# Patient Record
Sex: Male | Born: 1985 | Race: Black or African American | Hispanic: No | Marital: Married | Smoking: Current every day smoker
Health system: Southern US, Community
[De-identification: ages and names within clinical notes are randomized; demographics above are authoritative.]

---

## 2012-01-11 ENCOUNTER — Emergency Department: Payer: Self-pay | Admitting: Emergency Medicine

## 2012-06-02 ENCOUNTER — Ambulatory Visit: Payer: Self-pay | Admitting: Family Medicine

## 2014-07-09 ENCOUNTER — Encounter (HOSPITAL_BASED_OUTPATIENT_CLINIC_OR_DEPARTMENT_OTHER): Payer: Self-pay

## 2014-07-09 ENCOUNTER — Emergency Department (HOSPITAL_BASED_OUTPATIENT_CLINIC_OR_DEPARTMENT_OTHER)
Admission: EM | Admit: 2014-07-09 | Discharge: 2014-07-09 | Disposition: A | Discharge status: Home / Self Care | Payer: Self-pay | Attending: Emergency Medicine | Admitting: Emergency Medicine

## 2014-07-09 DIAGNOSIS — K088 Other specified disorders of teeth and supporting structures: Secondary | ICD-10-CM | POA: Insufficient documentation

## 2014-07-09 DIAGNOSIS — Z720 Tobacco use: Secondary | ICD-10-CM | POA: Insufficient documentation

## 2014-07-09 DIAGNOSIS — K0889 Other specified disorders of teeth and supporting structures: Secondary | ICD-10-CM

## 2014-07-09 MED ORDER — PENICILLIN V POTASSIUM 500 MG PO TABS: 500.0000 mg | ORAL_TABLET | Freq: Four times a day (QID) | ORAL | Status: AC

## 2014-07-09 MED ORDER — BUPIVACAINE-EPINEPHRINE (PF) 0.5% -1:200000 IJ SOLN
1.8000 mL | Freq: Once | INTRAMUSCULAR | Status: DC
  Filled 2014-07-09: qty 1.8

## 2014-07-09 NOTE — ED Notes (Signed)
Pt reports left lower dental pain with radiation upwards part of face, no swelling noted. Also reports uvular pain.

## 2014-07-09 NOTE — Discharge Instructions (Signed)
Return to the emergency room with worsening of symptoms, new symptoms or with symptoms that are concerning, fevers, unable to open mouth fully, facial swelling, tongue swelling, drooling, chest pain or shortness of breath. Please take all of your antibiotics until finished!   You may develop abdominal discomfort or diarrhea from the antibiotic.  You may help offset this with probiotics which you can buy or get in yogurt. Do not eat  or take the probiotics until 2 hours after your antibiotic.  Call to make appointment with the dentist above. Please call your doctor for a followup appointment within 24-48 hours. When you talk to your doctor please let them know that you were seen in the emergency department and have them acquire all of your records so that they can discuss the findings with you and formulate a treatment plan to fully care for your new and ongoing problems. If you do not have a primary care provider please call the number below under ED resources to establish care with a provider and follow up.    Emergency Department Resource Guide 1) Find a Doctor and Pay Out of Pocket Although you won't have to find out who is covered by your insurance plan, it is a good idea to ask around and get recommendations. You will then need to call the office and see if the doctor you have chosen will accept you as a new patient and what types of options they offer for patients who are self-pay. Some doctors offer discounts or will set up payment plans for their patients who do not have insurance, but you will need to ask so you aren't surprised when you get to your appointment.  2) Contact Your Local Health Department Not all health departments have doctors that can see patients for sick visits, but many do, so it is worth a call to see if yours does. If you don't know where your local health department is, you can check in your phone book. The CDC also has a tool to help you locate your state's health  department, and many state websites also have listings of all of their local health departments.  3) Find a Walk-in Clinic If your illness is not likely to be very severe or complicated, you may want to try a walk in clinic. These are popping up all over the country in pharmacies, drugstores, and shopping centers. They're usually staffed by nurse practitioners or physician assistants that have been trained to treat common illnesses and complaints. They're usually fairly quick and inexpensive. However, if you have serious medical issues or chronic medical problems, these are probably not your best option.  No Primary Care Doctor: - Call Health Connect at  [phone] - they can help you locate a primary care doctor that  accepts your insurance, provides certain services, etc. - Physician Referral Service- [phone]  Chronic Pain Problems: Organization         Address  Phone   Notes  [facility]  [phone] Patients need to be referred by their primary care doctor.   Medication Assistance: Organization         Address  Phone   Notes  [facility] [address] [place], [place] [phone] --Must be a resident of [facility] -- Must have NO insurance coverage whatsoever (no Medicaid/ Medicare, etc.) -- The pt. MUST have a primary care doctor that directs their care regularly and follows them in the  community   [facility]ist  [phone]   [address]  [phone]    Agencies that provide inexpensive medical care: Organization         Address  Phone   Notes  [facility]  [phone]   [facility]    [phone]   [facility] [address] [employer] [place], [place] [address] [phone]   [facility] [address], [place] [phone]   Planned Parenthood    [phone]   [facility]    [phone]    Community Health and [facility]  [address], [place] Phone:  [phone], Fax:  [phone] Hours of Operation:  9 am - 6 pm, M-F.  Also accepts Medicaid/Medicare and self-pay.  [facility]  [address], [place] Phone: [phone], Fax: [phone]. Hours of Operation:  8:30 am - 5:30 pm, M-F.  Also accepts Medicaid and self-pay.  [facility] [place] [address], [place] Phone: [phone]   [facility] [address] Natasha Bence [place], [place] [phone], Ext. [phone] Mondays & Thursdays: 7-9 AM.  First 15 patients are seen on a first come, first serve basis.    Medicaid-accepting [facility] Providers:  Organization         Address  Phone   Notes  [facility] [address], [place] [phone] Also accepts self-pay patients.  [facility] [address] Laurell Josephs [place], [place]  [phone]   [facility] [address], [place] [phone]   [facility] [address], [place] [phone]   Renaye Rakers [address], [place]   [phone] Only accepts [place] Access [place] patients after they have their name applied to their card.   Self-Pay (no insurance) in [facility]:  Organization         Address  Phone   Notes  Sickle Cell Patients, [facility] [address] [place], [place] [phone]   [facility] [address] [place], [place] [phone]   [facility]  [address], [place] [phone]   [facility]/Dr. Osei-Bonsu  [address] [place] or [address], [place] [phone] Phone number for both [place] and [place] locations is the same.  Urgent Medical and [facility] [address], [place] [phone]    [facility] [address] [place] or [address] [phone]   [facility] [address], [place] [phone], phone; [phone], fax Sees patients 1st and 3rd Saturday of every month.  Must not qualify for public or private insurance (i.e. Medicaid, Medicare, [place] Health Choice, Veterans' Benefits)  Household income should be no more than 200% of the poverty level The clinic cannot treat you if you are pregnant or think you are pregnant  Sexually transmitted diseases are not treated at the clinic.    Dental Care: Organization         Address  Phone  Notes  [facility] Department of [facility] [address] [place], [place] [phone] Accepts children up to age 49 who are enrolled in [place] or  [place] Health Choice; pregnant women with a Medicaid card; and children who have applied for Medicaid or [place] Health Choice, but were declined, whose parents can pay a reduced fee at time of service.  [facility] Department of [facility] [place]  [address], [place] [phone] Accepts children up to age 27 who are enrolled in [place] or [place] Health Choice; pregnant women with a Medicaid card; and children who have applied for Medicaid or [place] Health Choice, but were declined, whose parents can pay a reduced fee at time of service.  [facility]  [address], [place] [phone] Patients are seen by appointment only. Walk-ins are not accepted. [facility] will see patients 27 years of age and older. Monday - Tuesday (8am-5pm) Most Wednesdays (8:30-5pm) $30 per visit, cash only  [facility]  [address], [facility] [phone] Patients are seen by appointment only. Walk-ins are not accepted. [facility] will see patients 27 years of age and older. One Wednesday Evening (Monthly: Volunteer Based).   $30 per visit, cash only  [facility]  [phone] for adults; Children under age 134, call Graduate Pediatric Dentistry at [phone]. Children aged 244-14, please call [phone] to request a pediatric application.  Dental services are provided in all areas of dental care including fillings, crowns and bridges, complete and partial dentures, implants, gum treatment, root canals, and extractions. Preventive care is also provided. Treatment is provided to both adults and children. Patients are selected via a lottery and there is often a waiting list.   [facility] [address], [place]  [phone] [email or web address]   [facility] [address], [place], [place] [phone], Ext. [phone] Second and Fourth Thursday of each month, opens at 6:30 AM; Clinic ends at 9 AM.  Patients are seen on a first-come first-served basis, and a limited number are seen during each clinic.   [facility]  [address], [place], [place] [phone]   Eligibility Requirements You must have lived in [place], [place], or [place] counties for at least the last three months.   You cannot be eligible for state or federal sponsored [place]healthcare insurance, including CIGNAVeterans Administration, [place]icaid, or [employer]icare.   You generally cannot be eligible for healthcare insurance through your employer.    How to apply: Eligibility screenings are held every Tuesday and Wednesday afternoon from 1:00 pm until 4:00 pm. You do not need an appointment for the interview!  [facility] [address], [place], [place] [phone]   [facility]  [phone]   [facility]  [phone]   [facility]  [phone]    Behavioral Health Resources in the Community: Intensive Outpatient Programs Organization         Address  Phone  Notes  [facility] [address], [place], [place] [phone]   [facility] [address], [place], [place] [phone]   [facility] [address], [place], [place]  [phone]   [facility] [address],  [place], [place] [phone] or [phone]   Substance Abuse Resources Organization         Address  Phone  Notes  [facility] Drug [facility]  [phone]   [facility]  [phone]   [facility]  [phone]   Floydene Flock  [phone]   Residential & Outpatient Substance Abuse Program  [phone]   Psychological Services Organization         Address  Phone  Notes  [facility] Health  [phone]   [facility]  [phone]   [facility] [address] [place] [phone] or [phone]    Mobile Crisis Teams Organization         Address  Phone  Notes  [facility], Mobile Crisis Care Unit  [phone]   [facility]  [address] [place], [place] [phone]   Doristine Locks [address] [place] [phone]    Self-Help/Support Groups Organization         Address  Phone             Notes  [facility] [place] - variety of support groups  [phone]- [phone] Call for more information  Narcotics Anonymous (NA), [facility] [address], [employer] [place]  2 meetings at this location   Statistician         Address  Phone  Notes  [facility] [address],    [place]  [phone]   [facility]  [address], [place] [address], [place], [place] [phone]   [facility] [address] [place], [place] [phone] Admissions: 8am-3pm M-F  [facility] [address],    [place], [place] [phone]   [facility] [address] [place], [place], [place] [phone]   [facility] [address]  [place], [place] [phone]   [facility] - Intensive Outpatient [address] Laurell [address], [place], [place] [phone]   [facility] [address] [place].,  [place], [place] [phone] or [phone]   [facility] ([facility]) [address] [place], [place] [phone] Accepts Medicaid  [facility] [address]  [place] [phone] Substance Abuse/Addiction Treatment   [facility] Organization         Address  Phone  Notes  [facility]  [phone]   Angie Fava, PhD [address] Ervin Knack [place], [place]   [phone] or [phone]   [facility] Behavioral   [address] [place], [place] [phone]   [facility] [address], [place], [place] [phone] Insurance/Medicaid/sponsorship through [facility] and Families [address]                                    [place], [place] [phone] Therapy/tele-psych/case  [facility] [address], [place] [phone]    Dr. Lolly Mustache  [phone]   [facility]  [employer] [facility] Dept. 1) [address] [place] 2) [address] [place] 3)  [address], [place] [phone]  [phone]   [facility] Child Abuse Hotline [phone] or [phone] (After Hours)

## 2014-07-09 NOTE — ED Provider Notes (Signed)
CSN: [id number]     Arrival date & time 07/09/14  1535 History   First MD Initiated Contact with Patient 07/09/14 1717     Chief Complaint  Patient presents with  . Dental Pain     (Consider location/radiation/quality/duration/timing/severity/associated sxs/prior Treatment) HPI  Joshua Armstrong is a 29 y.o. male presenting with left lower dental pain for the past 2 days that radiates upward towards his face. No improvement with Tylenol, ibuprofen. Patient reports some improvement with cold or fluids. Last saw dentist was one year ago while he was incarcerated. No facial swelling, tongue swelling, fevers, chills, drooling, unable to open mouth, chest pain or shortness of breath. Patient does report dry cough and URI symptoms including sore throat. No history of diabetes. Patient is a smoker.   History reviewed. No pertinent past medical history. History reviewed. No pertinent past surgical history. No family history on file. History  Substance Use Topics  . Smoking status: Current Every Day Smoker -- 1.00 packs/day    Types: Cigarettes  . Smokeless tobacco: Not on file  . Alcohol Use: No    Review of Systems  Constitutional: Negative for fever and chills.  HENT: Positive for dental problem. Negative for drooling and facial swelling.   Respiratory: Negative for shortness of breath and stridor.   Cardiovascular: Negative for chest pain.      Allergies  Review of patient's allergies indicates no known allergies.  Home Medications   Prior to Admission medications   Medication Sig Start Date End Date Taking? Authorizing Provider  penicillin v potassium (VEETID) 500 MG tablet Take 1 tablet (500 mg total) by mouth 4 (four) times daily. 07/09/14 07/16/14  Oswaldo ConroyVictoria Gjon Letarte, PA-C   BP 146/86 mmHg  Pulse 101  Temp(Src) 98.4 F (36.9 C) (Oral)  Resp 20  Ht 5\' 11"  (1.803 m)  Wt 265 lb (120.203 kg)  BMI 36.98 kg/m2  SpO2 96% Physical Exam  Constitutional: He appears well-developed and  well-nourished. No distress.  HENT:  Head: Normocephalic and atraumatic.  Mouth/Throat: No oropharyngeal exudate.  No trismus or uvula deviation. Mild oropharynx erythema without edema. No lip, tongue, facial swelling. No swelling under tongue or tenderness. Patient handling secretions. No drooling. Patient with poor dentition. Patient with tenderness to left back molar that is erupting from the gum with mild erythema in this gingiva but no drainable abscess.   Eyes: Conjunctivae are normal. Right eye exhibits no discharge. Left eye exhibits no discharge.  Neck: Normal range of motion. Neck supple.  No neck masses or tenderness.  Cardiovascular: Normal rate and regular rhythm.   Pulmonary/Chest: Effort normal and breath sounds normal. No respiratory distress. He has no wheezes.  Abdominal: Soft. He exhibits no distension. There is no tenderness.  Lymphadenopathy:    He has no cervical adenopathy.  Neurological: He is alert. Coordination normal.  Skin: Skin is warm and dry. He is not diaphoretic.  Nursing note and vitals reviewed.   ED Course  Dental Date/Time: 07/09/2014 5:53 PM Performed by: Oswaldo ConroyREECH, Danis [place] Authorized by: Oswaldo ConroyREECH, Tannar Broker Consent: Verbal consent obtained. Risks and benefits: risks, benefits and alternatives were discussed Consent given by: patient Patient understanding: patient states understanding of the procedure being performed Patient consent: the patient's understanding of the procedure matches consent given Patient identity confirmed: verbally with patient Time out: Immediately prior to procedure a "time out" was called to verify the correct patient, procedure, equipment, support staff and site/side marked as required. Local anesthesia used: yes Local anesthetic: bupivacaine 0.5% with epinephrine  Anesthetic total: 1.8 ml Patient tolerance: Patient tolerated the procedure well with no immediate complications   (including critical care time) Labs  Review Labs Reviewed - No data to display  Imaging Review No results found.   EKG Interpretation None      MDM   Final diagnoses:  Pain, dental   Patient with toothache.  No gross abscess.  No history of diabetes. Exam unconcerning for Ludwig's angina or spread of infection.  Will treat with penicillin. Pt given dental block in ED with improvement of his symptoms. Urged patient to follow-up with dentist. Referral to on call dentist.  ED resources provided as well.  Discussed return precautions with patient. Discussed all results and patient verbalizes understanding and agrees with plan.     Oswaldo ConroyVictoria Garrie Elenes, PA-C 07/09/14 1817  Raeford RazorStephen Kohut, MD 07/09/14 2023

## 2015-11-17 ENCOUNTER — Emergency Department (HOSPITAL_BASED_OUTPATIENT_CLINIC_OR_DEPARTMENT_OTHER)
Admission: EM | Admit: 2015-11-17 | Discharge: 2015-11-17 | Disposition: A | Discharge status: Home / Self Care | Payer: Self-pay | Attending: Emergency Medicine | Admitting: Emergency Medicine

## 2015-11-17 ENCOUNTER — Encounter (HOSPITAL_BASED_OUTPATIENT_CLINIC_OR_DEPARTMENT_OTHER): Payer: Self-pay | Admitting: Emergency Medicine

## 2015-11-17 DIAGNOSIS — F17210 Nicotine dependence, cigarettes, uncomplicated: Secondary | ICD-10-CM | POA: Insufficient documentation

## 2015-11-17 DIAGNOSIS — R109 Unspecified abdominal pain: Secondary | ICD-10-CM

## 2015-11-17 DIAGNOSIS — R197 Diarrhea, unspecified: Secondary | ICD-10-CM | POA: Insufficient documentation

## 2015-11-17 DIAGNOSIS — R609 Edema, unspecified: Secondary | ICD-10-CM

## 2015-11-17 DIAGNOSIS — K118 Other diseases of salivary glands: Secondary | ICD-10-CM | POA: Insufficient documentation

## 2015-11-17 DIAGNOSIS — R1033 Periumbilical pain: Secondary | ICD-10-CM | POA: Insufficient documentation

## 2015-11-17 NOTE — Discharge Instructions (Signed)
You may have sludging or a small salivary gland stone. Try eating sour candies to help make more saliva and stay hydrated. If symptoms worsen and cause difficulty swallowing or breathing, please return to the nearest ED.  You abdominal pain appears to be related to your diarrhea episodes. If symptoms worsen, please return nearest ED.  Please schedule appointment with a primary care physician for long-term management of your symptoms.

## 2015-11-17 NOTE — ED Triage Notes (Signed)
Patient reports "knot" on neck which began a few days ago.  Denies fevers. Patient also reports abdominal pain which began 2 weeks ago.  Denies N/V.  Reports intermittent diarrhea.

## 2015-11-17 NOTE — ED Provider Notes (Signed)
[facility] Provider Note   CSN: [id number] Arrival date & time: 11/17/15  1134     History   Chief Complaint Chief Complaint  Patient presents with  . Cyst  . Abdominal Pain    HPI Joshua Armstrong is a 30 y.o. male with no significant past medical history who presents for 2 complaints, "knot"  under his tongue, and a several week history of diarrhea with associated abdominal pain. Patient reports that he noticed a bump under his left side of his tongue several days ago. Patient reports that it is not painful and he has not had this before. Patient denies any sore throat, difficulty breathing, difficulty swallowing, difficulty with neck movement. Patient denies any neck trauma and denies difficulty moving his mouth. Patient has no reported trismus. Patient's other complaint is several weeks of intermittent diarrhea with abdominal cramping. Patient denies constipation, dysuria, urinary symptoms, or any abdominal trauma. Patient reports he moved to a new apartment several weeks ago and thinks "the water is upsetting his stomach." He reports that it improved when he drank bottled water. Patient denies any fevers, chills, night sweats, chest pain, shortness of breath, nausea, vomiting. Patient has no other complaints today.  Diarrhea   This is a chronic problem. The current episode started more than 1 week ago. Episode frequency: every few days has an episode. The problem has not changed since onset.The stool consistency is described as watery. There has been no fever. Associated symptoms include abdominal pain (preceeding the diarrhea). Pertinent negatives include no vomiting, no chills, no sweats, no headaches, no URI and no cough. He has tried nothing for the symptoms. Risk factors include ill contacts. His past medical history does not include irritable bowel syndrome, inflammatory bowel disease, bowel resection or recent abdominal surgery.  Abdominal Pain   This is a chronic  problem. The current episode started more than 1 week ago. The problem occurs every several days. The problem has been resolved. The pain is located in the periumbilical region. The quality of the pain is cramping. The pain is at a severity of 6/10. The pain is moderate. Associated symptoms include diarrhea. Pertinent negatives include fever, melena, vomiting, dysuria, frequency and headaches. The symptoms are aggravated by palpation. The symptoms are relieved by bowel activity. His past medical history does not include irritable bowel syndrome.    History reviewed. No pertinent past medical history.  There are no active problems to display for this patient.   History reviewed. No pertinent surgical history.     Home Medications    Prior to Admission medications   Not on File    Family History History reviewed. No pertinent family history.  Social History Social History  Substance Use Topics  . Smoking status: Current Every Day Smoker    Packs/day: 1.00    Types: Cigarettes  . Smokeless tobacco: Never Used  . Alcohol use No     Allergies   Review of patient's allergies indicates no known allergies.   Review of Systems Review of Systems  Constitutional: Negative for appetite change, chills and fever.  HENT: Negative for congestion.   Eyes: Negative for visual disturbance.  Respiratory: Negative for cough, choking, chest tightness, shortness of breath, wheezing and stridor.   Cardiovascular: Negative for chest pain and palpitations.  Gastrointestinal: Positive for abdominal pain (preceeding the diarrhea) and diarrhea. Negative for melena and vomiting.  Genitourinary: Negative for difficulty urinating, discharge, dysuria, flank pain, frequency, genital sores, penile pain, penile swelling, scrotal swelling and  testicular pain.  Musculoskeletal: Negative for back pain.  Skin: Negative for wound.  Neurological: Negative for weakness, light-headedness, numbness and headaches.   Psychiatric/Behavioral: Negative for agitation and confusion.  All other systems reviewed and are negative.    Physical Exam Updated Vital Signs BP 143/85 (BP Location: Left Arm)   Pulse 72   Temp 98.3 F (36.8 C) (Oral)   Ht 5\' 10"  (1.778 m)   Wt 255 lb (115.7 kg)   SpO2 97%   BMI 36.59 kg/m   Physical Exam  Constitutional: He appears well-developed and well-nourished.  HENT:  Head: Atraumatic.  Mouth/Throat: Oropharynx is clear and moist. No oropharyngeal exudate.  Eyes: Conjunctivae and EOM are normal. Pupils are equal, round, and reactive to light.  Neck: Normal range of motion. Neck supple. No tracheal tenderness, no spinous process tenderness and no muscular tenderness present. No neck rigidity. Normal range of motion present.    Cardiovascular: Normal rate and regular rhythm.   No murmur heard. Pulmonary/Chest: Effort normal and breath sounds normal. No stridor. No respiratory distress. He has no wheezes. He exhibits no tenderness.  Abdominal: Soft. He exhibits no distension. There is no tenderness. There is no guarding. Hernia confirmed negative in the right inguinal area and confirmed negative in the left inguinal area.  Genitourinary: Testes normal and penis normal. No penile tenderness.  Musculoskeletal: He exhibits no edema or tenderness.  Lymphadenopathy:    He has cervical adenopathy. No inguinal adenopathy noted on the right or left side.  Neurological: He is alert. He exhibits normal muscle tone.  Skin: Skin is warm and dry. No erythema.  Psychiatric: He has a normal mood and affect.  Nursing note and vitals reviewed.    ED Treatments / Results  Labs (all labs ordered are listed, but only abnormal results are displayed) Labs Reviewed - No data to display  EKG  EKG Interpretation None       Radiology No results found.  Procedures Procedures (including critical care time)  Medications Ordered in ED Medications - No data to  display   Initial Impression / Assessment and Plan / ED Course  I have reviewed the triage vital signs and the nursing notes.  Pertinent labs & imaging results that were available during my care of the patient were reviewed by me and considered in my medical decision making (see chart for details).  Clinical Course    Joshua Armstrong is a 30 y.o. male with no significant past medical history who presents with diarrhea, abdominal discomfort, and a knot under his jaw. Patient's history and physical as above. Patient's story for abdominal symptoms appear to be consistent with pain associated with diarrheal episodes. Patient reports after his diarrhea, his discomfort is relieved. Patient reports that he has not had the abdominal pain in several days and on exam was completely nontender. GU exam revealed no hernias. Patient had no CVA tenderness or any other finding on abdominal exam. Given patient's reported history of symptoms beginning after switching water source, patient advised to try bottled water again which previously improved symptoms.   For knot under patient's jaw, exam consistent with salivary gland swelling. Patient did not have any findings such as trismus, nuchal rigidity, tonsillar swelling, uvular deviation, or retropharyngeal abnormality. Swelling under jaw was nontender. It was mobile and approximately half a centimeter in size. No induration under jaw, fluctuance, and no induration under tongue. Doubt Ludwig's angina or other infection at this time. Patient also advised that swelling could be lymph node  secondary to reaction from virus.  Patient instructed to use sour candy as well as compresses under jaw to try and alleviate possible sialolithiasis versus sludging of submandibular/sublingual gland. Patient given strict return precautions for any changes or findings concerning for infection. Patient understood return precautions for respiratory or swallowing changes.   Patient also  instructed to follow-up with PCP for long-term management plan of intermittent diarrhea. Patient also given return precautions for worsening symptoms. Patient instructed to stay hydrated.  Patient had no other questions or concerns and patient discharged in good condition.   Final Clinical Impressions(s) / ED Diagnoses   Final diagnoses:  Submandibular gland swelling  Diarrhea, unspecified type  Abdominal pain, unspecified abdominal location    New Prescriptions There are no discharge medications for this patient.  Clinical Impression: 1. Submandibular gland swelling   2. Diarrhea, unspecified type   3. Abdominal pain, unspecified abdominal location     Disposition: Discharge  Condition: Good  I have discussed the results, Dx and Tx plan with the pt(& family if present). He/she/they expressed understanding and agree(s) with the plan. Discharge instructions discussed at great length. Strict return precautions discussed and pt &/or family have verbalized understanding of the instructions. No further questions at time of discharge.    There are no discharge medications for this patient.   [facility] [address] [place] [id number] [phone] Schedule an appointment as soon as possible for a visit  If symptoms worsen, please return to the nearest ED.      Canary Brimhristopher J Liyah Higham, MD 11/17/15 1538

## 2016-10-26 ENCOUNTER — Encounter (HOSPITAL_BASED_OUTPATIENT_CLINIC_OR_DEPARTMENT_OTHER): Payer: Self-pay

## 2016-10-26 ENCOUNTER — Emergency Department (HOSPITAL_BASED_OUTPATIENT_CLINIC_OR_DEPARTMENT_OTHER): Payer: Self-pay

## 2016-10-26 ENCOUNTER — Emergency Department (HOSPITAL_BASED_OUTPATIENT_CLINIC_OR_DEPARTMENT_OTHER)
Admission: EM | Admit: 2016-10-26 | Discharge: 2016-10-26 | Disposition: A | Discharge status: Home / Self Care | Payer: Self-pay | Attending: Emergency Medicine | Admitting: Emergency Medicine

## 2016-10-26 DIAGNOSIS — B9789 Other viral agents as the cause of diseases classified elsewhere: Secondary | ICD-10-CM

## 2016-10-26 DIAGNOSIS — Z79899 Other long term (current) drug therapy: Secondary | ICD-10-CM | POA: Insufficient documentation

## 2016-10-26 DIAGNOSIS — J069 Acute upper respiratory infection, unspecified: Secondary | ICD-10-CM | POA: Insufficient documentation

## 2016-10-26 DIAGNOSIS — F17210 Nicotine dependence, cigarettes, uncomplicated: Secondary | ICD-10-CM | POA: Insufficient documentation

## 2016-10-26 LAB — COMPREHENSIVE METABOLIC PANEL
ALT: 16 U/L — ABNORMAL LOW (ref 17–63)
AST: 21 U/L (ref 15–41)
Albumin: 4.3 g/dL (ref 3.5–5.0)
Alkaline Phosphatase: 69 U/L (ref 38–126)
Anion gap: 8 (ref 5–15)
BUN: 12 mg/dL (ref 6–20)
CO2: 27 mmol/L (ref 22–32)
Calcium: 9.2 mg/dL (ref 8.9–10.3)
Chloride: 105 mmol/L (ref 101–111)
Creatinine, Ser: 0.87 mg/dL (ref 0.61–1.24)
GFR calc Af Amer: >60 mL/min (ref >60)
GFR calc non Af Amer: >60 mL/min (ref >60)
Glucose, Bld: 89 mg/dL (ref 65–99)
Potassium: 3.8 mmol/L (ref 3.5–5.1)
Sodium: 140 mmol/L (ref 135–145)
Total Bilirubin: 0.7 mg/dL (ref 0.3–1.2)
Total Protein: 7.8 g/dL (ref 6.5–8.1)

## 2016-10-26 LAB — CBC WITH DIFFERENTIAL/PLATELET
Basophils Absolute: 0 10*3/uL (ref 0.0–0.1)
Basophils Relative: 0 %
Eosinophils Absolute: 0.1 10*3/uL (ref 0.0–0.7)
Eosinophils Relative: 2 %
HCT: 39.9 % (ref 39.0–52.0)
Hemoglobin: 13.5 g/dL (ref 13.0–17.0)
Lymphocytes Relative: 14 %
Lymphs Abs: 0.9 10*3/uL (ref 0.7–4.0)
MCH: 29.1 pg (ref 26.0–34.0)
MCHC: 33.8 g/dL (ref 30.0–36.0)
MCV: 86 fL (ref 78.0–100.0)
Monocytes Absolute: 0.6 10*3/uL (ref 0.1–1.0)
Monocytes Relative: 9 %
Neutro Abs: 5 10*3/uL (ref 1.7–7.7)
Neutrophils Relative %: 75 %
Platelets: 175 10*3/uL (ref 150–400)
RBC: 4.64 MIL/uL (ref 4.22–5.81)
RDW: 13.7 % (ref 11.5–15.5)
WBC: 6.5 10*3/uL (ref 4.0–10.5)

## 2016-10-26 LAB — URINALYSIS, ROUTINE W REFLEX MICROSCOPIC
Bilirubin Urine: NEGATIVE
Glucose, UA: NEGATIVE mg/dL
Hgb urine dipstick: NEGATIVE
Ketones, ur: NEGATIVE mg/dL
Leukocytes, UA: NEGATIVE
NITRITE: NEGATIVE
Protein, ur: NEGATIVE mg/dL
SPECIFIC GRAVITY, URINE: 1.024 (ref 1.005–1.030)
pH: 7.5 (ref 5.0–8.0)

## 2016-10-26 MED ORDER — SODIUM CHLORIDE 0.9 % IV BOLUS (SEPSIS): 1000.0000 mL | Freq: Once | INTRAVENOUS | Status: DC

## 2016-10-26 MED ORDER — PROMETHAZINE-DM 6.25-15 MG/5ML PO SYRP: 5.0000 mL | ORAL_SOLUTION | Freq: Four times a day (QID) | ORAL | 0 refills | Status: AC | PRN

## 2016-10-26 MED ORDER — IBUPROFEN 800 MG PO TABS: 800.0000 mg | ORAL_TABLET | Freq: Three times a day (TID) | ORAL | 0 refills | Status: AC | PRN

## 2016-10-26 MED ORDER — GUAIFENESIN ER 1200 MG PO TB12: 1.0000 | ORAL_TABLET | Freq: Two times a day (BID) | ORAL | 0 refills | Status: AC

## 2016-10-26 NOTE — ED Triage Notes (Signed)
Pt reports non-productive cough, and RLQ pain, emesis x1 two days ago - after being on a recent cruise in the [place]. Denies fever, nausea, or diarrhea.

## 2016-10-26 NOTE — Discharge Instructions (Signed)
Return here as needed.  Increase your fluid intake.  Your x-rays did not show any abnormalities.  Take Imodium for your diarrhea.

## 2016-10-26 NOTE — ED Notes (Signed)
Instructed on NPO due to abd pain

## 2016-10-28 NOTE — ED Provider Notes (Signed)
[facility]PT Provider Note   CSN: [id number] Arrival date & time: 10/26/16  1152     History   Chief Complaint Chief Complaint  Patient presents with  . Cough  . RLQ pain    HPI Joshua Armstrong is a 31 y.o. male.  HPI Patient presents to the emergency department with cough and also some right lower quadrant abdominal pain.  He said he vomited once 2 days ago.  The patient states that he also had some diarrhea.  They recently returned from a trip to the [place].  The patient states that his cough seems to make the pain in his abdomen hurt more.  Patient states he did not take any medications other than over-the-counter cough and cold medications prior to arrival.  He states nothing seems make the condition better or worseThe patient denies chest pain, shortness of breath, headache,blurred vision, neck pain, fever,  weakness, numbness, dizziness, anorexia, edema, a nausea, vomiting, diarrhea, rash, back pain, dysuria, hematemesis, bloody stool, near syncope, or syncope. History reviewed. No pertinent past medical history.  There are no active problems to display for this patient.   History reviewed. No pertinent surgical history.     Home Medications    Prior to Admission medications   Medication Sig Start Date End Date Taking? Authorizing Provider  Guaifenesin 1200 MG TB12 Take 1 tablet (1,200 mg total) by mouth 2 (two) times daily. 10/26/16   Dede Dobesh, Cristal Deer, PA-C  ibuprofen (ADVIL,MOTRIN) 800 MG tablet Take 1 tablet (800 mg total) by mouth every 8 (eight) hours as needed. 10/26/16   Shadi Sessler, Cristal Deer, PA-C  promethazine-dextromethorphan (PROMETHAZINE-DM) 6.25-15 MG/5ML syrup Take 5 mLs by mouth 4 (four) times daily as needed for cough. 10/26/16   Charlestine Night, PA-C    Family History History reviewed. No pertinent family history.  Social History Social History  Substance Use Topics  . Smoking status: Current Every Day Smoker    Packs/day: 1.00    Types:  Cigarettes  . Smokeless tobacco: Never Used  . Alcohol use No     Allergies   Patient has no known allergies.   Review of Systems Review of Systems All other systems negative except as documented in the HPI. All pertinent positives and negatives as reviewed in the HPI.  Physical Exam Updated Vital Signs BP 134/80 (BP Location: Right Arm)   Pulse 70   Temp 98.5 F (36.9 C) (Oral)   Resp 18   Ht 5\' 10"  (1.778 m)   Wt 115.2 kg (254 lb)   SpO2 99%   BMI 36.45 kg/m   Physical Exam  Constitutional: He is oriented to person, place, and time. He appears well-developed and well-nourished. No distress.  HENT:  Head: Normocephalic and atraumatic.  Mouth/Throat: Oropharynx is clear and moist.  Eyes: Pupils are equal, round, and reactive to light.  Neck: Normal range of motion. Neck supple.  Cardiovascular: Normal rate, regular rhythm and normal heart sounds.  Exam reveals no gallop and no friction rub.   No murmur heard. Pulmonary/Chest: Effort normal and breath sounds normal. No respiratory distress. He has no wheezes.  Abdominal: Soft. Bowel sounds are normal. He exhibits no distension. There is no tenderness.  Neurological: He is alert and oriented to person, place, and time. He exhibits normal muscle tone. Coordination normal.  Skin: Skin is warm and dry. Capillary refill takes less than 2 seconds. No rash noted. No erythema.  Psychiatric: He has a normal mood and affect. His behavior is normal.  Nursing note  and vitals reviewed.    ED Treatments / Results  Labs (all labs ordered are listed, but only abnormal results are displayed) Labs Reviewed  COMPREHENSIVE METABOLIC PANEL - Abnormal; Notable for the following:       Result Value   ALT 16 (*)    All other components within normal limits  URINALYSIS, ROUTINE W REFLEX MICROSCOPIC  CBC WITH DIFFERENTIAL/PLATELET    EKG  EKG Interpretation None       Radiology No results found.  Procedures Procedures  (including critical care time)  Medications Ordered in ED Medications - No data to display   Initial Impression / Assessment and Plan / ED Course  I have reviewed the triage vital signs and the nursing notes.  Pertinent labs & imaging results that were available during my care of the patient were reviewed by me and considered in my medical decision making (see chart for details).     Patient be treated for URI with cough.  Told to return here as needed.  His abdominal pain seems to be minimal basilar dry.  No pain on my examination and no guarding or rebound tenderness.  The patient to monitor the abdominal discomfort if he has any worsening in his condition.  Return here as needed.  Will treat with medications for the URI  Final Clinical Impressions(s) / ED Diagnoses   Final diagnoses:  Viral URI with cough    New Prescriptions Discharge Medication List as of 10/26/2016  2:52 PM    START taking these medications   Details  Guaifenesin 1200 MG TB12 Take 1 tablet (1,200 mg total) by mouth 2 (two) times daily., Starting Sat 10/26/2016, Print    ibuprofen (ADVIL,MOTRIN) 800 MG tablet Take 1 tablet (800 mg total) by mouth every 8 (eight) hours as needed., Starting Sat 10/26/2016, Print    promethazine-dextromethorphan (PROMETHAZINE-DM) 6.25-15 MG/5ML syrup Take 5 mLs by mouth 4 (four) times daily as needed for cough., Starting Sat 10/26/2016, Print         Sherma Vanmetre, [place], PA-C 10/28/16 1709    Tilden Fossa, MD 10/30/16 [phone]

## 2018-05-14 IMAGING — CR DG CHEST 2V
2 series · 2 of 2 positions shown · non-contrast
Comparison: None.

CLINICAL DATA: Cough and chest congestion for 5 days.  Smoker.

EXAM:
CHEST  2 VIEW

[w chest pa]
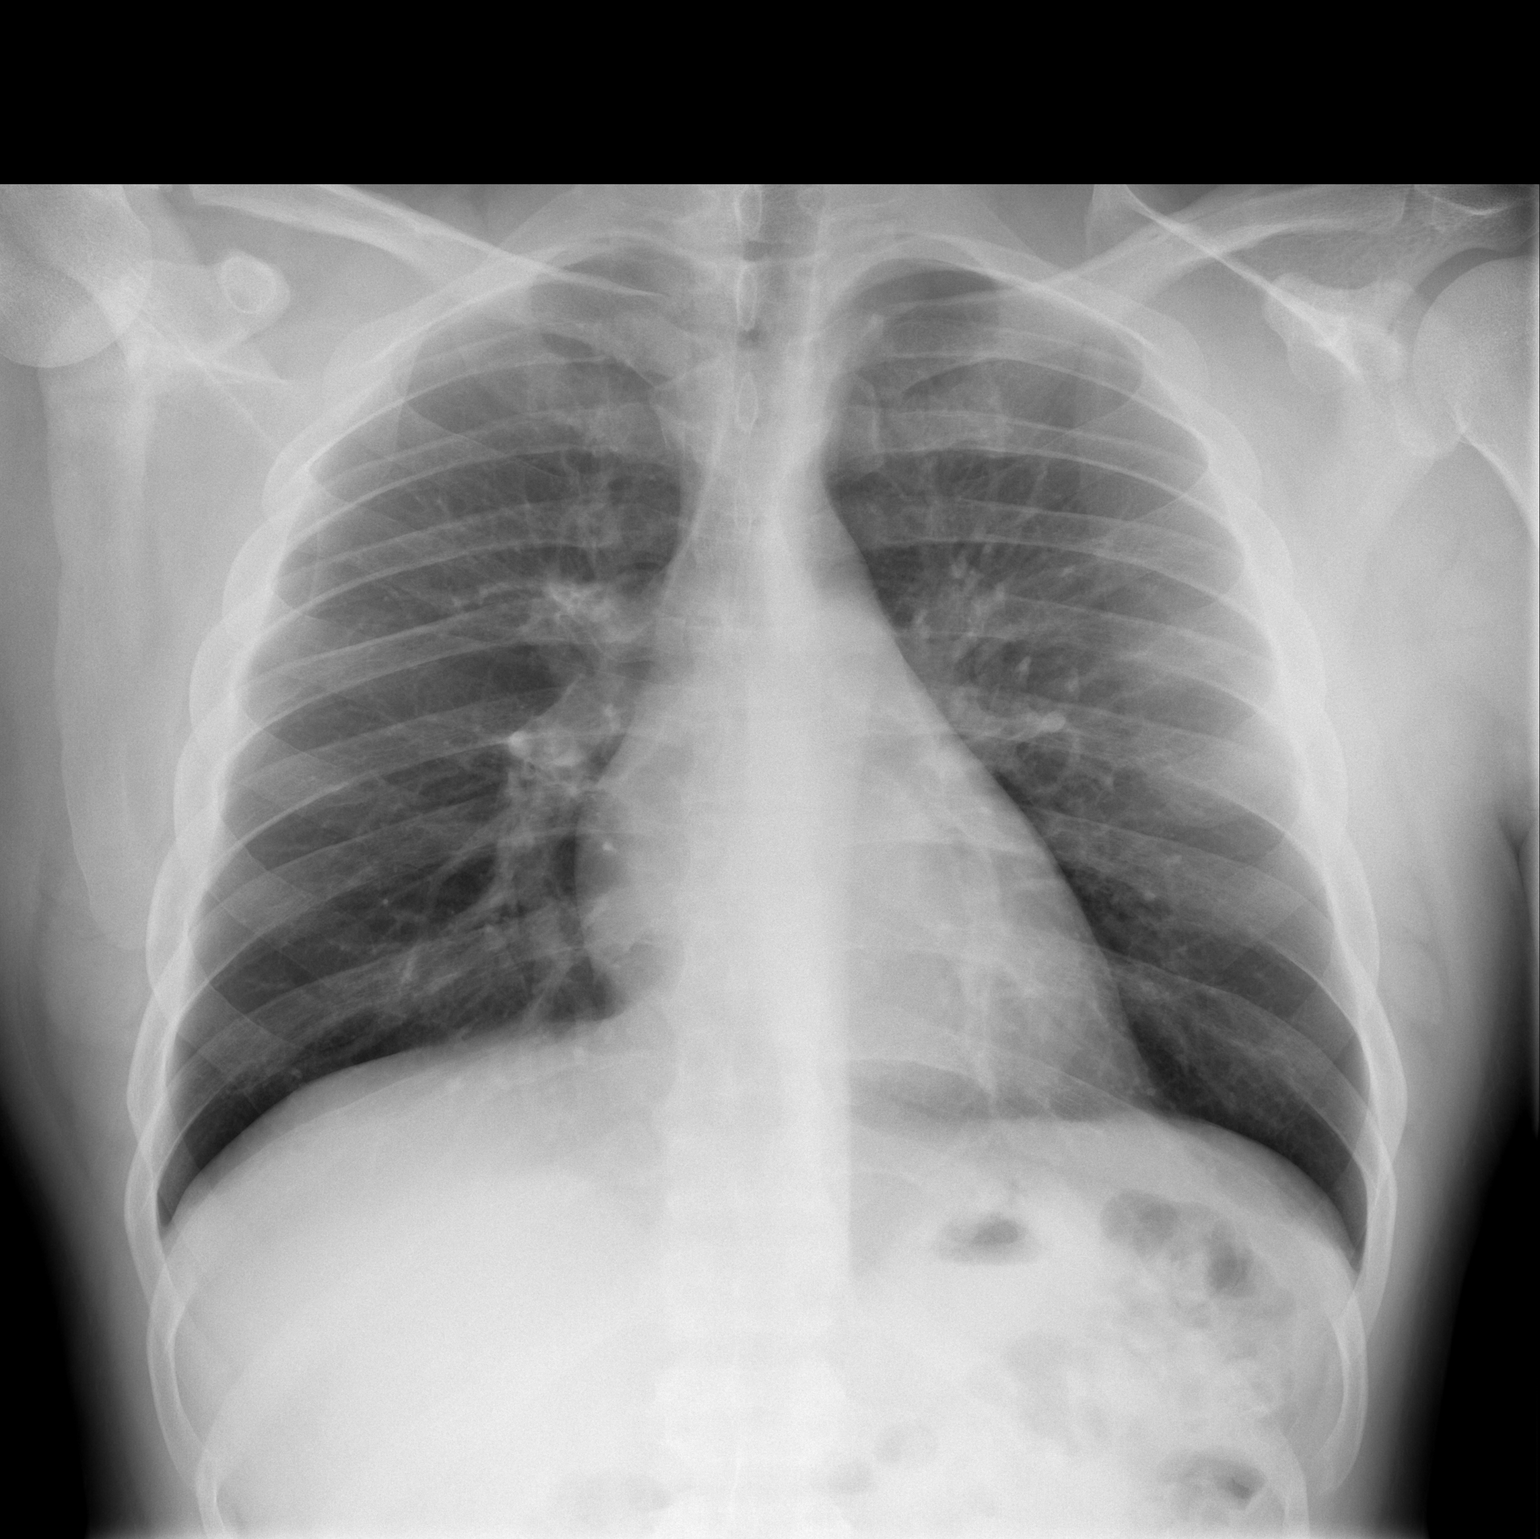

[w chest lat]
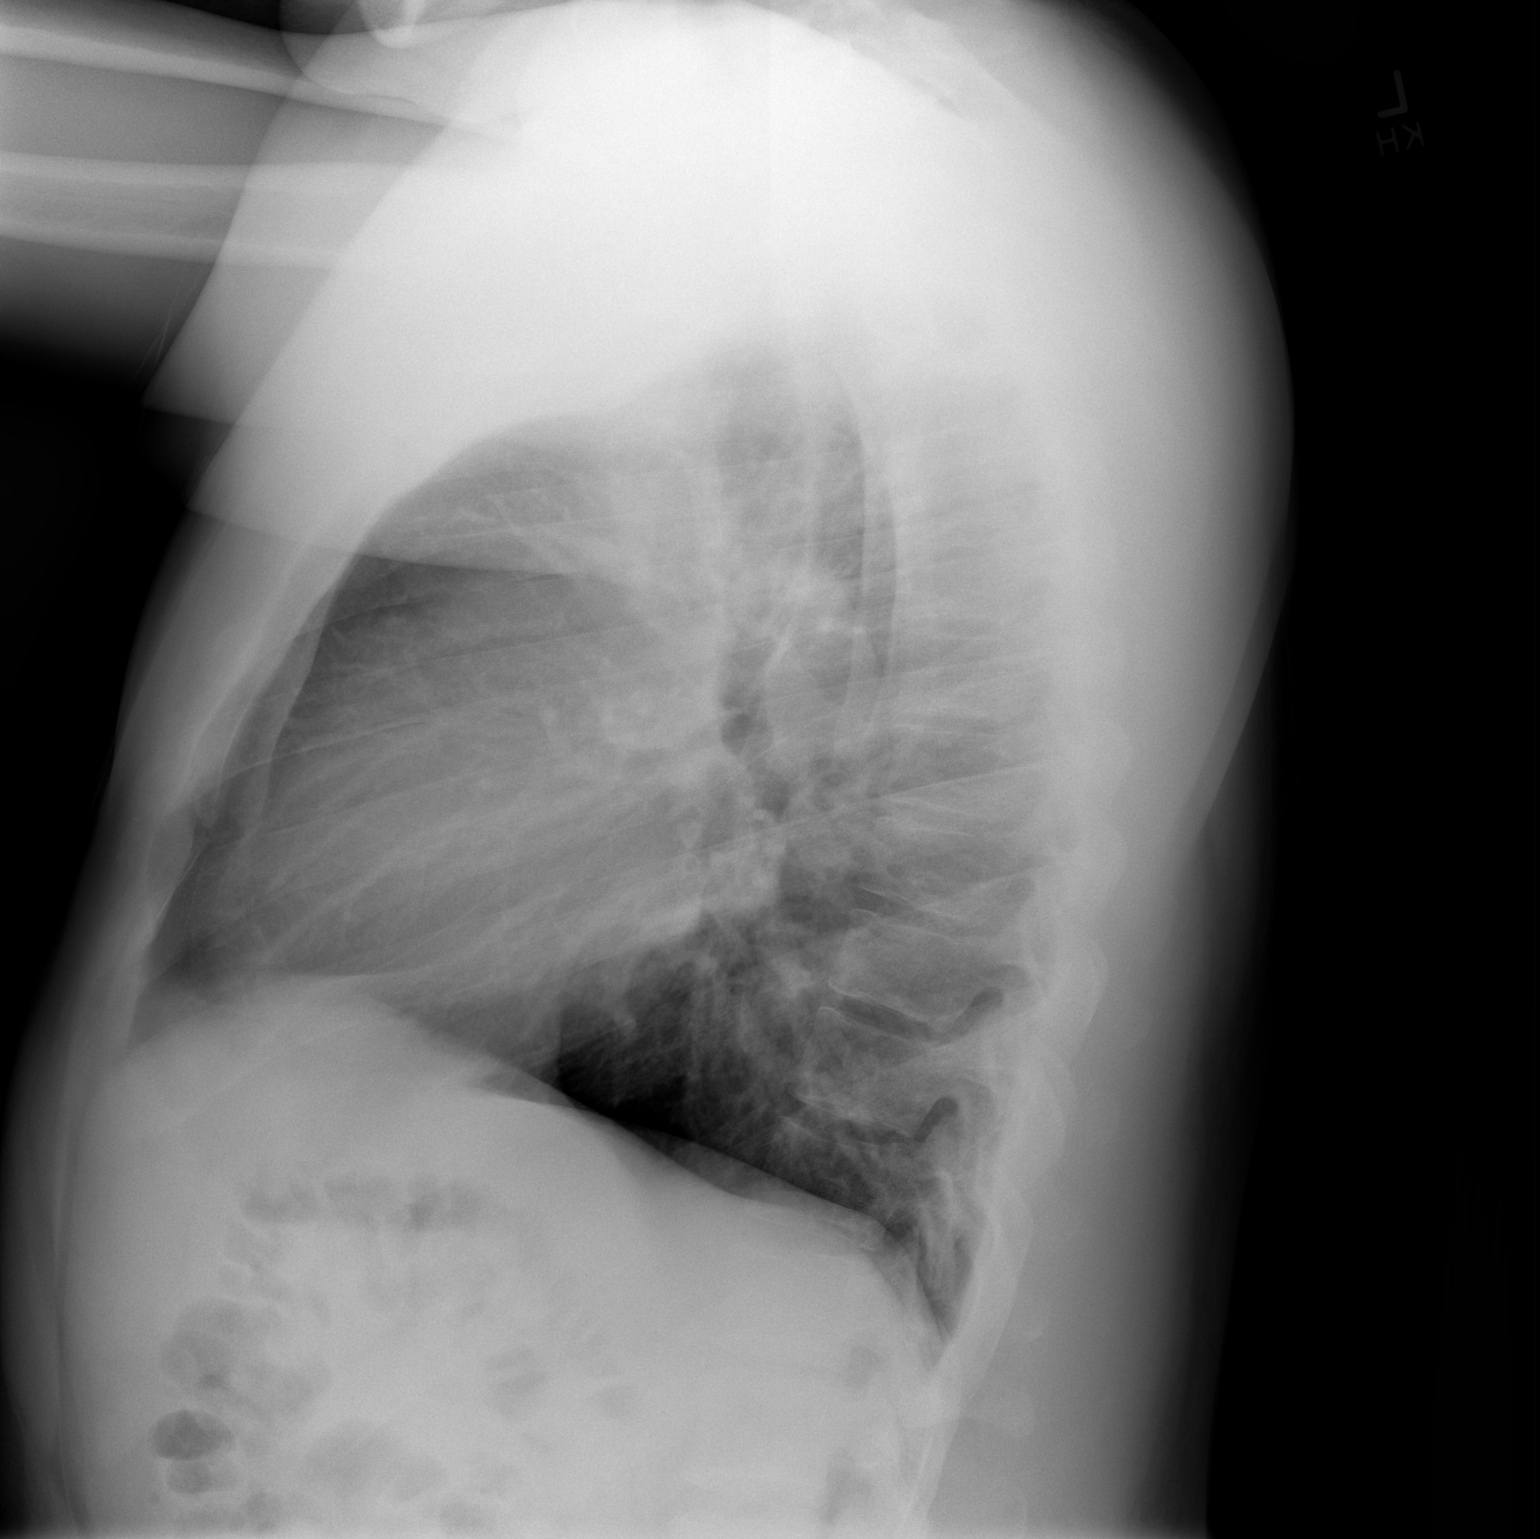

[2 of 2 positions shown; findings below may reference images not displayed]

FINDINGS: The heart size and mediastinal contours are within normal limits.
Both lungs are clear. The visualized skeletal structures are
unremarkable.
IMPRESSION: Negative.  No active cardiopulmonary disease.

## 2019-04-08 ENCOUNTER — Encounter (HOSPITAL_BASED_OUTPATIENT_CLINIC_OR_DEPARTMENT_OTHER): Payer: Self-pay | Admitting: *Deleted

## 2019-04-08 ENCOUNTER — Emergency Department (HOSPITAL_BASED_OUTPATIENT_CLINIC_OR_DEPARTMENT_OTHER)
Admission: EM | Admit: 2019-04-08 | Discharge: 2019-04-08 | Disposition: A | Discharge status: Home / Self Care | Payer: Self-pay | Attending: Emergency Medicine | Admitting: Emergency Medicine

## 2019-04-08 ENCOUNTER — Other Ambulatory Visit: Payer: Self-pay

## 2019-04-08 DIAGNOSIS — M545 Low back pain, unspecified: Secondary | ICD-10-CM

## 2019-04-08 DIAGNOSIS — Z79899 Other long term (current) drug therapy: Secondary | ICD-10-CM | POA: Insufficient documentation

## 2019-04-08 DIAGNOSIS — Y92410 Unspecified street and highway as the place of occurrence of the external cause: Secondary | ICD-10-CM | POA: Insufficient documentation

## 2019-04-08 DIAGNOSIS — Y999 Unspecified external cause status: Secondary | ICD-10-CM | POA: Insufficient documentation

## 2019-04-08 DIAGNOSIS — F17210 Nicotine dependence, cigarettes, uncomplicated: Secondary | ICD-10-CM | POA: Insufficient documentation

## 2019-04-08 DIAGNOSIS — S46912A Strain of unspecified muscle, fascia and tendon at shoulder and upper arm level, left arm, initial encounter: Secondary | ICD-10-CM | POA: Insufficient documentation

## 2019-04-08 DIAGNOSIS — Y9389 Activity, other specified: Secondary | ICD-10-CM | POA: Insufficient documentation

## 2019-04-08 MED ORDER — METHOCARBAMOL 500 MG PO TABS: 500.0000 mg | ORAL_TABLET | Freq: Every evening | ORAL | 0 refills | Status: AC | PRN

## 2019-04-08 MED FILL — METHOCARBAMOL 500 MG TABS: 500 | 5 days supply | Qty: 10 | Fill #0
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
## 2019-04-08 NOTE — Discharge Instructions (Addendum)
Take Ibuprofen as needed for the next week. Take this medicine with food. Take muscle relaxer at bedtime to help you sleep. This medicine makes you drowsy so do not take before driving or work Use a heating pad for sore muscles - use for 20 minutes several times a day Try gentle range of motion exercises to prevent stiffness Please follow up with sports medicine if you continue to have back pain
